# Patient Record
Sex: Female | Born: 1995 | Race: Black or African American | Hispanic: No | Marital: Single | Smoking: Former smoker
Health system: Southern US, Community
[De-identification: ages and names within clinical notes are randomized; demographics above are authoritative.]

---
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
## 2014-10-23 DIAGNOSIS — O14 Pre-eclampsia: Secondary | ICD-10-CM

## 2019-04-01 ENCOUNTER — Encounter: Payer: Medicaid Other | Admitting: Certified Nurse Midwife

## 2019-04-12 ENCOUNTER — Encounter: Payer: Medicaid Other | Admitting: Certified Nurse Midwife

## 2019-08-19 ENCOUNTER — Ambulatory Visit: Payer: Medicaid Other | Attending: Internal Medicine

## 2020-04-11 ENCOUNTER — Telehealth: Payer: Self-pay

## 2020-04-11 ENCOUNTER — Ambulatory Visit: Payer: Medicaid Other | Admitting: Obstetrics and Gynecology

## 2020-04-11 DIAGNOSIS — Z3201 Encounter for pregnancy test, result positive: Secondary | ICD-10-CM

## 2020-04-11 NOTE — Telephone Encounter (Signed)
Patient is being referred for Irregular menses and infertility with [facility] for 04/28/20. Patient reports 3 positive pregnancy with a negative pregnancy test. Patient is requesting for labs to be drawn. Would you place order?

## 2020-04-11 NOTE — Telephone Encounter (Signed)
Patient is scheduled for labs 05/12/20

## 2020-04-11 NOTE — Telephone Encounter (Signed)
Order placed. Pt needs lab appt. Thx

## 2020-04-12 ENCOUNTER — Other Ambulatory Visit: Payer: Medicaid Other

## 2020-04-12 ENCOUNTER — Other Ambulatory Visit: Payer: Self-pay

## 2020-04-12 ENCOUNTER — Ambulatory Visit: Payer: Medicaid Other | Admitting: Obstetrics and Gynecology

## 2020-04-12 ENCOUNTER — Encounter: Payer: Medicaid Other | Admitting: Obstetrics and Gynecology

## 2020-04-12 DIAGNOSIS — Z3201 Encounter for pregnancy test, result positive: Secondary | ICD-10-CM

## 2020-04-13 LAB — BETA HCG QUANT (REF LAB): hCG Quant: <1 m[IU]/mL

## 2020-04-13 NOTE — Progress Notes (Signed)
Pls let pt know pregnancy test neg. Has appt with [facility] 12/3.

## 2020-04-13 NOTE — Progress Notes (Signed)
Pt aware. Asked her lab result number, given <1.

## 2020-04-28 ENCOUNTER — Encounter: Payer: Medicaid Other | Admitting: Obstetrics and Gynecology

## 2020-04-28 ENCOUNTER — Ambulatory Visit (INDEPENDENT_AMBULATORY_CARE_PROVIDER_SITE_OTHER): Payer: Medicaid Other | Admitting: Obstetrics and Gynecology

## 2020-04-28 ENCOUNTER — Other Ambulatory Visit: Payer: Self-pay

## 2020-04-28 ENCOUNTER — Other Ambulatory Visit (HOSPITAL_COMMUNITY)
Admission: RE | Admit: 2020-04-28 | Discharge: 2020-04-28 | Disposition: A | Discharge status: Home / Self Care | Payer: Medicaid Other | Source: Ambulatory Visit | Attending: Obstetrics and Gynecology | Admitting: Obstetrics and Gynecology

## 2020-04-28 ENCOUNTER — Encounter: Payer: Self-pay | Admitting: Obstetrics and Gynecology

## 2020-04-28 VITALS — BP 136/72 | Ht 63.0 in | Wt 211.0 lb

## 2020-04-28 DIAGNOSIS — Z3169 Encounter for other general counseling and advice on procreation: Secondary | ICD-10-CM

## 2020-04-28 DIAGNOSIS — Z124 Encounter for screening for malignant neoplasm of cervix: Secondary | ICD-10-CM | POA: Diagnosis present

## 2020-04-28 DIAGNOSIS — Z113 Encounter for screening for infections with a predominantly sexual mode of transmission: Secondary | ICD-10-CM | POA: Diagnosis not present

## 2020-04-28 DIAGNOSIS — N925 Other specified irregular menstruation: Secondary | ICD-10-CM | POA: Diagnosis not present

## 2020-04-28 DIAGNOSIS — N839 Noninflammatory disorder of ovary, fallopian tube and broad ligament, unspecified: Secondary | ICD-10-CM

## 2020-04-28 DIAGNOSIS — N939 Abnormal uterine and vaginal bleeding, unspecified: Secondary | ICD-10-CM

## 2020-04-28 DIAGNOSIS — Z1329 Encounter for screening for other suspected endocrine disorder: Secondary | ICD-10-CM

## 2020-04-28 NOTE — Progress Notes (Signed)
Patient ID: Emily Pearson, female   DOB: 01-15-1996, 24 y.o.   MRN: [id number]  Reason for Consult: Gynecologic Exam   Referred by Cyndia Diver, PA-C  Subjective:     HPI:  Emily Pearson is a 24 y.o. female. She was referred for irregular menses, although she notes normal menstruation. She reports she has been trying to conceive for over a year and desires to obtain pregnancy.   Gynecological History Describes periods as occurring in a monthly fashion. 5-7 days of bleeding.  Last pap smear: unknown History of STDs: denies Sexually Active: yes  Obstetrical History  OB History  Gravida Para Term Preterm AB Living  3 2 2   1 2   SAB IAB Ectopic Multiple Live Births  1            # Outcome Date GA Lbr Len/2nd Weight Sex Delivery Anes PTL Lv  3 Term 10/23/14   7 lb 8 oz (3.402 kg) F         Complications: Pre-eclampsia  2 Term 11/13/12   3 lb 9 oz (1.616 kg) M      1 SAB 2013             Complications: Pre-eclampsia     History reviewed. No pertinent past medical history. Family History  Problem Relation Age of Onset  . Hypertension Mother   . Hyperlipidemia Mother    History reviewed. No pertinent surgical history.  Short Social History:  Social History   Tobacco Use  . Smoking status: Former 2014  . Smokeless tobacco: Never Used  Substance Use Topics  . Alcohol use: Yes    Comment: occ    No Known Allergies  No current outpatient medications on file.   No current facility-administered medications for this visit.    Review of Systems  Constitutional: Negative for chills, fatigue, fever and unexpected weight change.  HENT: Negative for trouble swallowing.  Eyes: Negative for loss of vision.  Respiratory: Negative for cough, shortness of breath and wheezing.  Cardiovascular: Negative for chest pain, leg swelling, palpitations and syncope.  GI: Negative for abdominal pain, blood in stool, diarrhea, nausea and vomiting.  GU: Negative for difficulty  urinating, dysuria, frequency and hematuria.  Musculoskeletal: Negative for back pain, leg pain and joint pain.  Skin: Negative for rash.  Neurological: Negative for dizziness, headaches, light-headedness, numbness and seizures.  Psychiatric: Negative for behavioral problem, confusion, depressed mood and sleep disturbance.        Objective:  Objective   Vitals:   04/28/20 1017  BP: 136/72  Weight: 211 lb (95.7 kg)  Height: 5\' 3"  (1.6 m)   Body mass index is 37.38 kg/m.  Physical Exam Vitals and nursing note reviewed.  Constitutional:      Appearance: She is well-developed.  HENT:     Head: Normocephalic and atraumatic.  Eyes:     Pupils: Pupils are equal, round, and reactive to light.  Cardiovascular:     Rate and Rhythm: Normal rate and regular rhythm.  Pulmonary:     Effort: Pulmonary effort is normal. No respiratory distress.  Genitourinary:    Comments: External: Normal appearing vulva. No lesions noted.  Speculum examination: Normal appearing cervix. No blood in the vaginal vault. no discharge.   Bimanual examination: Uterus midline, non-tender, normal in size, shape and contour.  No CMT. No adnexal masses. No adnexal tenderness. Pelvis not fixed.    Skin:    General: Skin is warm and dry.  Neurological:  Mental Status: She is alert and oriented to person, place, and time.  Psychiatric:        Behavior: Behavior normal.        Thought Content: Thought content normal.        Judgment: Judgment normal.     Assessment/Plan:     24 yo with irregular menses.  She would like to follow up regarding infertility HSG planned Will plan progesterone testing [facility] and TSH testing today  [facility] infertility referral  Pap smear and STD testing today Will return for pelvic US.   More than 30 minutes were spent face to face with the patient in the room, reviewing the medical record, labs and images, and coordinating care for the patient. The plan of management was  discussed in detail and counseling was provided.    Adelene Idler MD [facility], [facility] 04/28/2020 11:12 AM

## 2020-04-28 NOTE — Patient Instructions (Addendum)
Hysterosalpingography  Hysterosalpingography is a procedure in which a doctor looks inside a woman's womb (uterus) and fallopian tubes. During the procedure, a dye is injected into the womb. Then X-rays are taken. The dye makes the womb show up on X-rays. What happens before the procedure?  Schedule the procedure after your period stops, but before your next ovulation. This is usually between day 5 and day 10 of your last period. Day 1 is the first day of your period.  Ask your doctor about changing or stopping your normal medicines. This is important if you take diabetes medicines or blood thinners.  Pee before the procedure starts.  Plan to have someone take you home. What happens during the procedure?  You may be given one of these: ? A medicine to help you relax (sedative). ? An over-the-counter pain medicine.  You will lie down on your back. Your feet will be placed in foot rests (stirrups).  A device (speculum) will be placed into your vagina. This lets your doctor see the lower part of your womb (cervix).  Your cervix will be washed with a soap that kills germs.  A medicine may be injected into your cervix to numb it (local anesthesia).  A tube will be passed into your womb.  Dye will be passed through the tube and into the womb. The dye may cause some cramps.  X-rays will be taken.  The tube will be taken out. The dye will flow out of your vagina on its own. The procedure may vary among doctors and hospitals. What happens after the procedure?  Most of the dye will flow out on its own. Wear a pad if needed.  You may have mild cramping and vaginal bleeding.  Do not drive for 24 hours if you were given a medicine to help you relax.  It is up to you to get the results of your test. Ask your doctor when your results will be ready. Summary  Hysterosalpingography is a procedure in which a doctor looks inside a woman's womb and fallopian tubes.  In this procedure, dye  is injected into the womb. Then, X-rays are taken. The dye makes the womb show up on the X-rays.  Plan to have this procedure after your period stops, but before your next ovulation. This is often between days 5 and 10 of your last period. Day 1 is the first day of your period.  After the procedure, you may have mild cramping and bleeding. Most of the dye will flow out on its own. Wear a pad if needed. This information is not intended to replace advice given to you by your health care provider. Make sure you discuss any questions you have with your health care provider. Document Revised: 04/25/2017 Document Reviewed: 06/05/2016 Elsevier Patient Education  2020 Elsevier Inc.   Female Infertility  Female infertility refers to a woman's inability to get pregnant (conceive) after a year of having sex regularly (or after 6 months in women over age 35) without using birth control. Infertility can also mean that a woman is not able to carry a pregnancy to full term. Both women and men can have fertility problems. What are the causes? This condition may be caused by:  Problems with reproductive organs. Infertility can result if a woman: ? Has an abnormally short cervix or a cervix that does not remain closed during a pregnancy. ? Has a blockage or scarring in the fallopian tubes. ? Has an abnormally shaped uterus. ? Has   uterine fibroids. This is a benign mass of tissue or muscle (tumor) that can develop in the uterus. ? Is not ovulating in a regular way.  Certain medical conditions. These may include: ? Polycystic ovary syndrome (PCOS). This is a hormonal disorder that can cause small cysts to grow on the ovaries. This is the most common cause of infertility in women. ? Endometriosis. This is a condition in which the tissue that lines the uterus (endometrium) grows outside of its normal location. ? Cancer and cancer treatments, such as chemotherapy or radiation. ? Premature ovarian failure. This  is when ovaries stop producing eggs and hormones before age 40. ? Sexually transmitted diseases, such as chlamydia or gonorrhea. ? Autoimmune disorders. These are disorders in which the body's defense system (immune system) attacks normal, healthy cells. Infertility can be linked to more than one cause. For some women, the cause of infertility is not known (unexplained infertility). What increases the risk?  Age. A woman's fertility declines with age, especially after her mid-30s.  Being underweight or overweight.  Drinking too much alcohol.  Using drugs such as anabolic steroids, cocaine, and marijuana.  Exercising excessively.  Being exposed to environmental toxins, such as radiation, pesticides, and certain chemicals. What are the signs or symptoms? The main sign of infertility in women is the inability to get pregnant or carry a pregnancy to full term. How is this diagnosed? This condition may be diagnosed by:  Checking whether you are ovulating each month. The tests may include: ? Blood tests to check hormone levels. ? An ultrasound of the ovaries. ? Taking a small tissue that lines the uterus and checking it under a microscope (endometrial biopsy).  Doing additional tests. This is done if ovulation is normal. Tests may include: ? Hysterosalpingography. This X-ray test can show the shape of the uterus and whether the fallopian tubes are open. ? Laparoscopy. This test uses a lighted tube (laparoscope) to look for problems in the fallopian tubes and other organs. ? Transvaginal ultrasound. This imaging test is used to check for abnormalities in the uterus and ovaries. ? Hysteroscopy. This test uses a lighted tube to check for problems in the cervix and the uterus. To be diagnosed with infertility, both partners will have a physical exam. Both partners will also have an extensive medical and sexual history taken. Additional tests may be done. How is this treated? Treatment depends  on the cause of infertility. Most cases of infertility in women are treated with medicine or surgery.  Women may take medicine to: ? Correct ovulation problems. ? Treat other health conditions.  Surgery may be done to: ? Repair damage to the ovaries, fallopian tubes, cervix, or uterus. ? Remove growths from the uterus. ? Remove scar tissue from the uterus, pelvis, or other organs. Assisted reproductive technology (ART) Assisted reproductive technology (ART) refers to all treatments and procedures that combine eggs and sperm outside the body to try to help a couple conceive. ART is often combined with fertility drugs to stimulate ovulation. Sometimes ART is done using eggs retrieved from another woman's body (donor eggs) or from previously frozen fertilized eggs (embryos). There are different types of ART. These include:  Intrauterine insemination (IUI). A long, thin tube is used to place sperm directly into a woman's uterus. This procedure: ? Is effective for infertility caused by sperm problems, including low sperm count and low motility. ? Can be used in combination with fertility drugs.  In vitro fertilization (IVF). This is done when   a woman's fallopian tubes are blocked or when a man has low sperm count. In this procedure: ? Fertility drugs are used to stimulate the ovaries to produce multiple eggs. ? Once mature, these eggs are removed from the body and combined with the sperm to be fertilized. ? The fertilized eggs are then placed into the woman's uterus. Follow these instructions at home:  Take over-the-counter and prescription medicines only as told by your health care provider.  Do not use any products that contain nicotine or tobacco, such as cigarettes and e-cigarettes. If you need help quitting, ask your health care provider.  If you drink alcohol, limit how much you have to 1 drink a day.  Make dietary changes to lose weight or maintain a healthy weight. Work with your  health care provider and a dietitian to set a weight-loss goal that is healthy and reasonable for you.  Seek support from a counselor or support group to talk about your concerns related to infertility. Couples counseling may be helpful for you and your partner.  Practice stress reduction techniques that work well for you, such as regular physical activity, meditation, or deep breathing.  Keep all follow-up visits as told by your health care provider. This is important. Contact a health care provider if you:  Feel that stress is interfering with your life and relationships.  Have side effects from treatments for infertility. Summary  Female infertility refers to a woman's inability to get pregnant (conceive) after a year of having sex regularly (or after 6 months in women over age 35) without using birth control.  To be diagnosed with infertility, both partners will have a physical exam. Both partners will also have an extensive medical and sexual history taken.  Seek support from a counselor or support group to talk about your concerns related to infertility. Couples counseling may be helpful for you and your partner. This information is not intended to replace advice given to you by your health care provider. Make sure you discuss any questions you have with your health care provider. Document Revised: 09/03/2018 Document Reviewed: 04/14/2017 Elsevier Patient Education  2020 Elsevier Inc.  

## 2020-04-28 NOTE — Progress Notes (Signed)
[facility] referring for irregular menses [facility] paper record

## 2020-05-01 ENCOUNTER — Telehealth: Payer: Self-pay | Admitting: Obstetrics and Gynecology

## 2020-05-01 ENCOUNTER — Other Ambulatory Visit: Payer: Self-pay

## 2020-05-01 DIAGNOSIS — N839 Noninflammatory disorder of ovary, fallopian tube and broad ligament, unspecified: Secondary | ICD-10-CM

## 2020-05-01 NOTE — Telephone Encounter (Signed)
Her progesterone labs have to be within 20-23 days after the first day of her menstrual cycle. She will also need a copy of her labs picked up from here and taken with her to that outside lab. They tend not to transfer over in the computers.

## 2020-05-01 NOTE — Telephone Encounter (Signed)
I rec'd call from pt wanting to schedule HSG w [facility]. She adv LMP 04/26/20.  She is scheduled for HSG @ [facility] - 05/02/20 @ 1:30  She also adv that she needed to schedule labwork at [facility] outside facility for progesterone levels. I was able to pull up the [facility] scheduling site and assisted her with scheduling that appt. She is scheduled for 05/01/20 at 1:30 at [address] [facility] location.

## 2020-05-02 ENCOUNTER — Ambulatory Visit
Admission: RE | Admit: 2020-05-02 | Discharge: 2020-05-02 | Disposition: A | Discharge status: Home / Self Care | Payer: Medicaid Other | Source: Ambulatory Visit | Attending: Obstetrics and Gynecology | Admitting: Obstetrics and Gynecology

## 2020-05-02 ENCOUNTER — Other Ambulatory Visit: Payer: Self-pay

## 2020-05-02 ENCOUNTER — Other Ambulatory Visit: Payer: Self-pay | Admitting: Obstetrics and Gynecology

## 2020-05-02 DIAGNOSIS — N926 Irregular menstruation, unspecified: Secondary | ICD-10-CM

## 2020-05-02 DIAGNOSIS — N979 Female infertility, unspecified: Secondary | ICD-10-CM

## 2020-05-02 DIAGNOSIS — N7011 Chronic salpingitis: Secondary | ICD-10-CM

## 2020-05-02 DIAGNOSIS — Z3169 Encounter for other general counseling and advice on procreation: Secondary | ICD-10-CM

## 2020-05-02 DIAGNOSIS — N839 Noninflammatory disorder of ovary, fallopian tube and broad ligament, unspecified: Secondary | ICD-10-CM

## 2020-05-02 LAB — CYTOLOGY - PAP
Chlamydia: NEGATIVE
Comment: NEGATIVE
Comment: NEGATIVE
Comment: NORMAL
Diagnosis: NEGATIVE
Neisseria Gonorrhea: NEGATIVE
Trichomonas: NEGATIVE

## 2020-05-02 LAB — PROGESTERONE: Progesterone: 0.2 ng/mL

## 2020-05-02 MED ORDER — IBUPROFEN 600 MG PO TABS: 600.0000 mg | ORAL_TABLET | Freq: Four times a day (QID) | ORAL | 0 refills | Status: DC | PRN

## 2020-05-02 MED ORDER — IOHEXOL 300 MG/ML  SOLN
30.0000 mL | Freq: Once | INTRAMUSCULAR | Status: AC | PRN
  Administered 2020-05-02: 30 mL via INTRATHECAL

## 2020-05-02 MED ORDER — TRAMADOL HCL 50 MG PO TABS: 50.0000 mg | ORAL_TABLET | Freq: Four times a day (QID) | ORAL | 0 refills | Status: DC | PRN

## 2020-05-02 MED ORDER — DOXYCYCLINE HYCLATE 100 MG PO CAPS: 100.0000 mg | ORAL_CAPSULE | Freq: Two times a day (BID) | ORAL | 0 refills | Status: AC

## 2020-05-02 NOTE — Progress Notes (Signed)
Prep and drape done.  Cervix normal.  Tenaculum placed. Cervix dilated minimally.  Catheter placed into uterine cavity. Approximately 20 mL dye injected under fluoroscopic visualization.  Patent bilateral fallopian tube seen.  Catheter and tenaculum removed.  Pt stable, tolerated procedure well.  Discussed result of HSG with patient. Both fallopian tubes dilated, good spillage of fluid on the left. Right fallopian tube appears blocked.   Discussed labs remaining as part on infertility evaluation. Patient will return for day 20-23 labs.  Discussed risks of ectopic pregnancy associated with dilated fallopian tubes and encouraged early prenatal visit for trending of beta hcg levels if pregnancy is achieved.   Prescription for doxycycline and pain medication sent to pharmacy for the patient.   Adelene Idler MD, [facility], [facility] 05/02/2020 2:33 PM

## 2020-05-29 ENCOUNTER — Ambulatory Visit: Payer: Medicaid Other

## 2020-05-29 ENCOUNTER — Ambulatory Visit: Payer: Medicaid Other | Admitting: Obstetrics and Gynecology

## 2020-08-09 ENCOUNTER — Ambulatory Visit (INDEPENDENT_AMBULATORY_CARE_PROVIDER_SITE_OTHER): Payer: Medicaid Other | Admitting: Advanced Practice Midwife

## 2020-08-09 ENCOUNTER — Other Ambulatory Visit (HOSPITAL_COMMUNITY)
Admission: RE | Admit: 2020-08-09 | Discharge: 2020-08-09 | Disposition: A | Discharge status: Home / Self Care | Payer: Medicaid Other | Source: Ambulatory Visit | Attending: Advanced Practice Midwife | Admitting: Advanced Practice Midwife

## 2020-08-09 ENCOUNTER — Encounter: Payer: Self-pay | Admitting: Advanced Practice Midwife

## 2020-08-09 ENCOUNTER — Other Ambulatory Visit: Payer: Self-pay

## 2020-08-09 VITALS — BP 130/80 | Wt 211.0 lb

## 2020-08-09 DIAGNOSIS — Z369 Encounter for antenatal screening, unspecified: Secondary | ICD-10-CM | POA: Insufficient documentation

## 2020-08-09 DIAGNOSIS — Z113 Encounter for screening for infections with a predominantly sexual mode of transmission: Secondary | ICD-10-CM

## 2020-08-09 DIAGNOSIS — Z130 Encounter for screening for diseases of the blood and blood-forming organs and certain disorders involving the immune mechanism: Secondary | ICD-10-CM

## 2020-08-09 DIAGNOSIS — O09299 Supervision of pregnancy with other poor reproductive or obstetric history, unspecified trimester: Secondary | ICD-10-CM

## 2020-08-09 DIAGNOSIS — O0991 Supervision of high risk pregnancy, unspecified, first trimester: Secondary | ICD-10-CM | POA: Insufficient documentation

## 2020-08-09 DIAGNOSIS — O99211 Obesity complicating pregnancy, first trimester: Secondary | ICD-10-CM

## 2020-08-09 DIAGNOSIS — Z131 Encounter for screening for diabetes mellitus: Secondary | ICD-10-CM

## 2020-08-09 DIAGNOSIS — Z1379 Encounter for other screening for genetic and chromosomal anomalies: Secondary | ICD-10-CM

## 2020-08-09 DIAGNOSIS — Z3A01 Less than 8 weeks gestation of pregnancy: Secondary | ICD-10-CM

## 2020-08-09 NOTE — Patient Instructions (Signed)

## 2020-08-10 LAB — PROTEIN / CREATININE RATIO, URINE
Creatinine, Urine: 53.8 mg/dL
Protein, Ur: 5.1 mg/dL
Protein/Creat Ratio: 95 mg/g creat (ref 0–200)

## 2020-08-11 ENCOUNTER — Encounter: Payer: Self-pay | Admitting: Advanced Practice Midwife

## 2020-08-11 DIAGNOSIS — O0991 Supervision of high risk pregnancy, unspecified, first trimester: Secondary | ICD-10-CM | POA: Insufficient documentation

## 2020-08-11 DIAGNOSIS — O09299 Supervision of pregnancy with other poor reproductive or obstetric history, unspecified trimester: Secondary | ICD-10-CM | POA: Insufficient documentation

## 2020-08-11 DIAGNOSIS — O99211 Obesity complicating pregnancy, first trimester: Secondary | ICD-10-CM | POA: Insufficient documentation

## 2020-08-11 LAB — CERVICOVAGINAL ANCILLARY ONLY
Chlamydia: NEGATIVE
Comment: NEGATIVE
Comment: NEGATIVE
Comment: NORMAL
Neisseria Gonorrhea: NEGATIVE
Trichomonas: NEGATIVE

## 2020-08-11 LAB — URINE CULTURE

## 2020-08-11 NOTE — Addendum Note (Signed)
Addended by: Tresea Mall on: 08/11/2020 12:02 PM   Modules accepted: Orders

## 2020-08-11 NOTE — Progress Notes (Addendum)
New Obstetric Patient H&P  Date of Service: 08/09/2020  Chief Complaint: "Desires prenatal care"   History of Present Illness: Patient is a 25 y.o. [id number] Not Hispanic or Latino female, presents with amenorrhea and positive home pregnancy test. Patient's last menstrual period was 06/18/2020. and based on her  LMP, her EDD is Estimated Date of Delivery: 03/25/21 and her EGA is [redacted]w[redacted]d. Cycles are 5 days, regular, and occur approximately every : 26 days. Her last pap smear was 3 months ago and was no abnormalities.    She had a urine pregnancy test which was positive 3 week(s)  ago. Her last menstrual period was normal and lasted for  5 day(s). Since her LMP she claims she has experienced breast tenderness, fatigue, nausea, vomiting 2x. She denies vaginal bleeding. Her past medical history is noncontributory. Her prior pregnancies are notable for G1 SAB 2013, G2 2014 PT SVD IUGR Preeclampsia female 3#9oz, G3 2016 FT SVD Preeclampsia female 7#8oz.  Since her LMP, she admits to the use of tobacco products  She quit w +UPT She claims she has gained 6 pounds since the start of her pregnancy.  There are cats in the home in the home  no  She admits close contact with children on a regular basis  yes  She has had chicken pox in the past no She has had Tuberculosis exposures, symptoms, or previously tested positive for TB   no Current or past history of domestic violence. no  Genetic Screening/Teratology Counseling: (Includes patient, baby's father, or anyone in either family with:)   1. Patient's age >/= 16 [facility]  no 2. Thalassemia ([place], [place], Mediterranean, or Asian background): MCV<80  no 3. Neural tube defect (meningomyelocele, spina bifida, anencephaly)  no 4. Congenital heart defect  no  5. Down syndrome  Patient's aunt (by marriage) has a child with DS 6. Tay-Sachs (Jewish, [place]  no 7. Canavan's Disease  no 8. Sickle cell disease or trait (African)  no  9. Hemophilia or  other blood disorders  no  10. Muscular dystrophy  no  11. Cystic fibrosis  no  12. Huntington's Chorea  no  13. Mental retardation/autism  no 14. Other inherited genetic or chromosomal disorder  no 15. Maternal metabolic disorder (DM, PKU, etc)  no 16. Patient or FOB with a child with a birth defect not listed above no  16a. Patient or FOB with a birth defect themselves no 17. Recurrent pregnancy loss, or stillbirth  no  18. Any medications since LMP other than prenatal vitamins (include vitamins, supplements, OTC meds, drugs, alcohol)  no 19. Any other genetic/environmental exposure to discuss  no  Infection History:   1. Lives with someone with TB or TB exposed  no  2. Patient or partner has history of genital herpes  no 3. Rash or viral illness since LMP  no 4. History of STI (GC, CT, HPV, syphilis, HIV)  no 5. History of recent travel :  no  Other pertinent information:  no    Review of Systems:10 point review of systems negative unless otherwise noted in HPI  Past Medical History:  Patient Active Problem List   Diagnosis Date Noted  . Hx of preeclampsia, prior pregnancy, currently pregnant 08/11/2020  . Supervision of high risk pregnancy in first trimester 08/11/2020    Clinic [facility] Prenatal Labs  Dating  Blood type:     Genetic Screen 1 Screen:    AFP:     Quad:  NIPS: Antibody:   Anatomic [place]  Rubella:    Varicella: @VZVIGG @  GTT Early:                Third trimester:  RPR:     Rhogam  HBsAg:     Vaccines TDAP:                       Flu Shot: Covid: HIV:     Baby Food                                GBS:   GC/CT:  Contraception  Pap: December 2021 negative  CBB     CS/VBAC    Support Person January 2022       . Obesity affecting pregnancy in first trimester 08/11/2020    Past Surgical History:  History reviewed. No pertinent surgical history.  Gynecologic History: Patient's last menstrual period was 06/18/2020.  Obstetric History: 06/20/2020  Family  History:  Family History  Problem Relation Age of Onset  . Hypertension Mother   . Hyperlipidemia Mother     Social History:  Social History   Socioeconomic History  . Marital status: Single    Spouse name: Not on file  . Number of children: Not on file  . Years of education: Not on file  . Highest education level: Not on file  Occupational History  . Not on file  Tobacco Use  . Smoking status: Former [id number]  . Smokeless tobacco: Never Used  Substance and Sexual Activity  . Alcohol use: Yes    Comment: occ  . Drug use: Not Currently  . Sexual activity: Yes    Birth control/protection: None  Other Topics Concern  . Not on file  Social History Narrative  . Not on file   Social Determinants of Health   Financial Resource Strain: Not on file  Food Insecurity: Not on file  Transportation Needs: Not on file  Physical Activity: Not on file  Stress: Not on file  Social Connections: Not on file  Intimate Partner Violence: Not on file    Allergies:  No Known Allergies  Medications: Prior to Admission medications   Medication Sig Start Date End Date Taking? Authorizing Provider  Prenatal Vit-Fe Fumarate-FA (MULTIVITAMIN-PRENATAL) 27-0.8 MG TABS tablet Take 1 tablet by mouth daily at 12 noon.   Yes [provider]  ibuprofen (ADVIL) 600 MG tablet Take 1 tablet (600 mg total) by mouth every 6 (six) hours as needed. Patient not taking: Reported on 08/09/2020 05/02/20   14/7/21, MD  traMADol (ULTRAM) 50 MG tablet Take 1 tablet (50 mg total) by mouth every 6 (six) hours as needed. Patient not taking: Reported on 08/09/2020 05/02/20   14/7/21, MD    Physical Exam Vitals: Blood pressure 130/80, weight 211 lb (95.7 kg), last menstrual period 06/18/2020.  General: NAD HEENT: normocephalic, anicteric Thyroid: no enlargement, no palpable nodules Pulmonary: No increased work of breathing, CTAB Cardiovascular: RRR, distal pulses 2+ Abdomen:  NABS, soft, non-tender, non-distended.  Umbilicus without lesions.  No hepatomegaly, splenomegaly or masses palpable. No evidence of hernia  Genitourinary:  External: Normal external female genitalia.  Normal urethral meatus, normal Bartholin's and Skene's glands.    Vagina: Normal vaginal mucosa, no evidence of prolapse.    Cervix: Grossly normal in appearance, no bleeding, no CMT  Uterus:  Non-enlarged, mobile, normal contour.    Adnexa: ovaries  non-enlarged, no adnexal masses  Rectal: deferred Extremities: no edema, erythema, or tenderness Neurologic: Grossly intact Psychiatric: mood appropriate, affect full   The following were addressed during this visit:  Breastfeeding Education - Early initiation of breastfeeding    Comments: Keeps milk supply adequate, helps contract uterus and slow bleeding, and early milk is the perfect first food and is easy to digest.   - The importance of exclusive breastfeeding    Comments: Provides antibodies, Lower risk of breast and ovarian cancers, and type-2 diabetes,Helps your body recover, Reduced chance of SIDS.   - Risks of giving your baby anything other than breast milk if you are breastfeeding    Comments: Make the baby less content with breastfeeds, may make my baby more susceptible to illness, and may reduce my milk supply.   - The importance of early skin-to-skin contact    Comments: Keeps baby warm and secure, helps keep baby's blood sugar up and breathing steady, easier to bond and breastfeed, and helps calm baby.  - Rooming-in on a 24-hour basis    Comments: Easier to learn baby's feeding cues, easier to bond and get to know each other, and encourages milk production.   - Feeding on demand or baby-led feeding    Comments: Helps prevent breastfeeding complications, helps bring in good milk supply, prevents under or overfeeding, and helps baby feel content and satisfied   - Frequent feeding to help assure optimal milk production     Comments: Making a full supply of milk requires frequent removal of milk from breasts, infant will eat 8-12 times in 24 hours, if separated from infant use breast massage, hand expression and/ or pumping to remove milk from breasts.   - Effective positioning and attachment    Comments: Helps my baby to get enough breast milk, helps to produce an adequate milk supply, and helps prevent nipple pain and damage   - Exclusive breastfeeding for the first 6 months    Comments: Builds a healthy milk supply and keeps it up, protects baby from sickness and disease, and breastmilk has everything your baby needs for the first 6 months.   Assessment: 25 y.o. [id number] at [redacted]w[redacted]d by LMP presenting to initiate prenatal care  Plan: 1) Avoid alcoholic beverages. 2) Patient encouraged not to smoke.  3) Discontinue the use of all non-medicinal drugs and chemicals.  4) Take prenatal vitamins daily.  5) Nutrition, food safety (fish, cheese advisories, and high nitrite foods) and exercise discussed. 6) Hospital and practice style discussed with cross coverage system.  7) Genetic Screening, such as with 1st Trimester Screening, cell free fetal DNA, AFP testing, and Ultrasound, as well as with amniocentesis and CVS as appropriate, is discussed with patient. At the conclusion of today's visit patient declined genetic testing 8) Patient is asked about travel to areas at risk for the [place] virus, and counseled to avoid travel and exposure to mosquitoes or sexual partners who may have themselves been exposed to the virus. Testing is discussed, and will be ordered as appropriate. 9) Aptima, urine culture, P/C ratio today 10) Return to clinic in 1 week for dating, early 1 hr gtt, NOB panel, sickle cell screen, CMP, ROB   Tresea Mall, CNM [facility] 08/11/2020, 10:59 AM

## 2020-08-15 ENCOUNTER — Ambulatory Visit (INDEPENDENT_AMBULATORY_CARE_PROVIDER_SITE_OTHER): Payer: Medicaid Other

## 2020-08-15 ENCOUNTER — Encounter: Payer: Medicaid Other | Admitting: Advanced Practice Midwife

## 2020-08-15 ENCOUNTER — Other Ambulatory Visit: Payer: Self-pay

## 2020-08-15 ENCOUNTER — Other Ambulatory Visit: Payer: Self-pay | Admitting: Advanced Practice Midwife

## 2020-08-15 DIAGNOSIS — O0991 Supervision of high risk pregnancy, unspecified, first trimester: Secondary | ICD-10-CM | POA: Diagnosis not present

## 2020-08-15 DIAGNOSIS — Z369 Encounter for antenatal screening, unspecified: Secondary | ICD-10-CM

## 2020-08-16 ENCOUNTER — Other Ambulatory Visit: Payer: Medicaid Other

## 2020-08-16 ENCOUNTER — Encounter: Payer: Medicaid Other | Admitting: Obstetrics and Gynecology

## 2020-08-17 ENCOUNTER — Other Ambulatory Visit: Payer: Medicaid Other

## 2020-08-17 ENCOUNTER — Ambulatory Visit (INDEPENDENT_AMBULATORY_CARE_PROVIDER_SITE_OTHER): Payer: Medicaid Other | Admitting: Advanced Practice Midwife

## 2020-08-17 ENCOUNTER — Encounter: Payer: Self-pay | Admitting: Advanced Practice Midwife

## 2020-08-17 ENCOUNTER — Other Ambulatory Visit: Payer: Self-pay

## 2020-08-17 VITALS — BP 100/70 | Wt 211.0 lb

## 2020-08-17 DIAGNOSIS — O0991 Supervision of high risk pregnancy, unspecified, first trimester: Secondary | ICD-10-CM

## 2020-08-17 DIAGNOSIS — O99211 Obesity complicating pregnancy, first trimester: Secondary | ICD-10-CM

## 2020-08-17 DIAGNOSIS — Z1379 Encounter for other screening for genetic and chromosomal anomalies: Secondary | ICD-10-CM

## 2020-08-17 DIAGNOSIS — Z113 Encounter for screening for infections with a predominantly sexual mode of transmission: Secondary | ICD-10-CM

## 2020-08-17 DIAGNOSIS — Z130 Encounter for screening for diseases of the blood and blood-forming organs and certain disorders involving the immune mechanism: Secondary | ICD-10-CM

## 2020-08-17 DIAGNOSIS — Z369 Encounter for antenatal screening, unspecified: Secondary | ICD-10-CM

## 2020-08-17 DIAGNOSIS — O09299 Supervision of pregnancy with other poor reproductive or obstetric history, unspecified trimester: Secondary | ICD-10-CM

## 2020-08-17 DIAGNOSIS — Z3A08 8 weeks gestation of pregnancy: Secondary | ICD-10-CM

## 2020-08-17 DIAGNOSIS — Z131 Encounter for screening for diabetes mellitus: Secondary | ICD-10-CM

## 2020-08-17 LAB — POCT URINALYSIS DIPSTICK OB
Glucose, UA: NEGATIVE
POC,PROTEIN,UA: NEGATIVE

## 2020-08-17 NOTE — Progress Notes (Signed)
  Routine Prenatal Care Visit  Subjective  Emily Pearson is a 25 y.o. [phone] at [redacted]w[redacted]d being seen today for ongoing prenatal care.  She is currently monitored for the following issues for this high-risk pregnancy and has Hx of preeclampsia, prior pregnancy, currently pregnant; Supervision of high risk pregnancy in first trimester; and Obesity affecting pregnancy in first trimester on their problem list.  ----------------------------------------------------------------------------------- Patient reports no complaints.   Contractions: Not present. Vag. Bleeding: None.   . Leaking Fluid denies.  ----------------------------------------------------------------------------------- The following portions of the patient's history were reviewed and updated as appropriate: allergies, current medications, past family history, past medical history, past social history, past surgical history and problem list. Problem list updated.  Objective  Blood pressure 100/70, weight 211 lb (95.7 kg), last menstrual period 06/18/2020. Pregravid weight 205 lb (93 kg) Total Weight Gain 6 lb (2.722 kg) Urinalysis: Urine Protein    Urine Glucose    Fetal Status:            Dating scan 08/15/20: equals LMP, no adjustment of EDD  General:  Alert, oriented and cooperative. Patient is in no acute distress.  Skin: Skin is warm and dry. No rash noted.   Cardiovascular: Normal heart rate noted  Respiratory: Normal respiratory effort, no problems with respiration noted  Abdomen: Soft, gravid, appropriate for gestational age. Pain/Pressure: Absent     Pelvic:  Cervical exam deferred        Extremities: Normal range of motion.     Mental Status: Normal mood and affect. Normal behavior. Normal judgment and thought content.   Assessment   25 y.o. [id number] at [redacted]w[redacted]d by  03/25/2021, by Last Menstrual Period presenting for routine prenatal visit  Plan   [email or web address]  Problems (from 06/18/20 to present)    Problem Noted Resolved    Supervision of high risk pregnancy in first trimester 08/11/2020 by Tresea Mall, CNM No   Overview Addendum 08/17/2020 10:29 AM by Tresea Mall, CNM    Clinic [facility] Prenatal Labs  Dating EDD by LMP=8w Blood type:     Genetic Screen 1 Screen:    AFP:     Quad:     NIPS: Antibody:   Anatomic [place]  Rubella:    Varicella: @VZVIGG @  GTT Early:                Third trimester:  RPR:     Rhogam  HBsAg:     Vaccines TDAP:                       Flu Shot: Covid: HIV:     Baby Food                                GBS:   GC/CT:  Contraception  Pap: December 2021 negative  CBB     CS/VBAC    Support Person January 2022          Previous Version       Preterm labor symptoms and general obstetric precautions including but not limited to vaginal bleeding, contractions, leaking of fluid and fetal movement were reviewed in detail with the patient.   Return in about 4 weeks (around 09/14/2020) for rob.  09/16/2020, CNM 08/17/2020 10:30 AM

## 2020-08-21 LAB — RPR+RH+ABO+RUB AB+AB SCR+CB...
Antibody Screen: NEGATIVE
HIV Screen 4th Generation wRfx: NONREACTIVE
Hematocrit: 39.8 % (ref 34.0–46.6)
Hemoglobin: 13.5 g/dL (ref 11.1–15.9)
Hepatitis B Surface Ag: NEGATIVE
MCH: 30.8 pg (ref 26.6–33.0)
MCHC: 33.9 g/dL (ref 31.5–35.7)
MCV: 91 fL (ref 79–97)
Platelets: 189 10*3/uL (ref 150–450)
RBC: 4.39 x10E6/uL (ref 3.77–5.28)
RDW: 12.8 % (ref 11.7–15.4)
RPR Ser Ql: NONREACTIVE
Rh Factor: POSITIVE
Rubella Antibodies, IGG: 2.53 index (ref >0.99)
Varicella zoster IgG: 505 index (ref >165)
WBC: 8.4 10*3/uL (ref 3.4–10.8)

## 2020-08-21 LAB — COMPREHENSIVE METABOLIC PANEL
ALT: 15 IU/L (ref 0–32)
AST: 10 IU/L (ref 0–40)
Albumin/Globulin Ratio: 1.8 (ref 1.2–2.2)
Albumin: 4 g/dL (ref 3.9–5.0)
Alkaline Phosphatase: 59 IU/L (ref 44–121)
BUN/Creatinine Ratio: 10 (ref 9–23)
BUN: 5 mg/dL — ABNORMAL LOW (ref 6–20)
Bilirubin Total: 0.3 mg/dL (ref 0.0–1.2)
CO2: 21 mmol/L (ref 20–29)
Calcium: 8.7 mg/dL (ref 8.7–10.2)
Chloride: 103 mmol/L (ref 96–106)
Creatinine, Ser: 0.49 mg/dL — ABNORMAL LOW (ref 0.57–1.00)
Globulin, Total: 2.2 g/dL (ref 1.5–4.5)
Glucose: 72 mg/dL (ref 65–99)
Potassium: 3.9 mmol/L (ref 3.5–5.2)
Sodium: 139 mmol/L (ref 134–144)
Total Protein: 6.2 g/dL (ref 6.0–8.5)
eGFR: 133 mL/min/{1.73_m2} (ref >59)

## 2020-08-21 LAB — GLUCOSE, 1 HOUR GESTATIONAL: Gestational Diabetes Screen: 79 mg/dL (ref 65–139)

## 2020-08-21 LAB — HGB FRACTIONATION CASCADE
Hgb A2: 2.5 % (ref 1.8–3.2)
Hgb A: 97 % (ref 96.4–98.8)
Hgb F: 0.5 % (ref 0.0–2.0)
Hgb S: 0 %

## 2020-09-14 ENCOUNTER — Ambulatory Visit (INDEPENDENT_AMBULATORY_CARE_PROVIDER_SITE_OTHER): Payer: Medicaid Other | Admitting: Obstetrics and Gynecology

## 2020-09-14 ENCOUNTER — Other Ambulatory Visit: Payer: Self-pay

## 2020-09-14 ENCOUNTER — Encounter: Payer: Self-pay | Admitting: Obstetrics and Gynecology

## 2020-09-14 VITALS — BP 132/82 | Wt 213.0 lb

## 2020-09-14 DIAGNOSIS — Z3689 Encounter for other specified antenatal screening: Secondary | ICD-10-CM

## 2020-09-14 DIAGNOSIS — Z1379 Encounter for other screening for genetic and chromosomal anomalies: Secondary | ICD-10-CM

## 2020-09-14 DIAGNOSIS — O0991 Supervision of high risk pregnancy, unspecified, first trimester: Secondary | ICD-10-CM

## 2020-09-14 DIAGNOSIS — O161 Unspecified maternal hypertension, first trimester: Secondary | ICD-10-CM

## 2020-09-14 DIAGNOSIS — Z3A12 12 weeks gestation of pregnancy: Secondary | ICD-10-CM

## 2020-09-14 DIAGNOSIS — Z3149 Encounter for other procreative investigation and testing: Secondary | ICD-10-CM

## 2020-09-14 LAB — POCT URINALYSIS DIPSTICK OB
Glucose, UA: NEGATIVE
POC,PROTEIN,UA: NEGATIVE

## 2020-09-14 MED ORDER — NIFEDIPINE ER OSMOTIC RELEASE 30 MG PO TB24: 30.0000 mg | ORAL_TABLET | Freq: Every day | ORAL | 2 refills | Status: DC

## 2020-09-14 NOTE — Progress Notes (Signed)
Routine Prenatal Care Visit  Subjective  Emily Pearson is a 25 y.o. [phone] at 100w4d being seen today for ongoing prenatal care.  She is currently monitored for the following issues for this high-risk pregnancy and has Hx of preeclampsia, prior pregnancy, currently pregnant; Supervision of high risk pregnancy in first trimester; and Obesity affecting pregnancy in first trimester on their problem list.  ----------------------------------------------------------------------------------- Patient reports no complaints.   Contractions: Not present. Vag. Bleeding: None.  Movement: Absent. Denies leaking of fluid.  ----------------------------------------------------------------------------------- The following portions of the patient's history were reviewed and updated as appropriate: allergies, current medications, past family history, past medical history, past social history, past surgical history and problem list. Problem list updated.   Objective  Blood pressure 140/82 - 132/82, weight 213 lb (96.6 kg), last menstrual period 06/18/2020. Pregravid weight 205 lb (93 kg) Total Weight Gain 8 lb (3.629 kg) Urinalysis:      Fetal Status: Fetal Heart Rate (bpm): 155   Movement: Absent     General:  Alert, oriented and cooperative. Patient is in no acute distress.  Skin: Skin is warm and dry. No rash noted.   Cardiovascular: Normal heart rate noted  Respiratory: Normal respiratory effort, no problems with respiration noted  Abdomen: Soft, gravid, appropriate for gestational age. Pain/Pressure: Absent     Pelvic:  Cervical exam deferred        Extremities: Normal range of motion.     ental Status: Normal mood and affect. Normal behavior. Normal judgment and thought content.    Assessment   25 y.o. [id number] at [redacted]w[redacted]d by  03/25/2021, by Last Menstrual Period presenting for routine prenatal visit  Plan   [email or web address]  Problems (from 06/18/20 to present)    Problem Noted Resolved   Supervision  of high risk pregnancy in first trimester 08/11/2020 by Tresea Mall, CNM No   Overview Addendum 08/17/2020 10:29 AM by Tresea Mall, CNM    Nursing Staff Provider  Office Location  [facility] Dating   LMP = 8wk [place]  Language  English Anatomy US   ordered  Flu Vaccine   UTD Genetic Screen  NIPS: Collected  TDaP vaccine    Hgb A1C or  GTT Early : Third trimester :   Rhogam   n/a   LAB RESULTS   Feeding Plan  Breast Blood Type O/Positive/-- (03/24 1208)   Contraception   Antibody Negative (03/24 1208)  Circumcision  Rubella 2.53 (03/24 1208)  Pediatrician   RPR Non Reactive (03/24 1208)   Support Person  Earl Lites HBsAg Negative (03/24 1208)   Prenatal Classes  HIV Non Reactive (03/24 1208)    Varicella  immune  BTL Consent  GBS  (For PCN allergy, check sensitivities)        VBAC Consent  Pap  12/21 - negative    Hgb Electro   normal hgb   Covid  vax x2 CF      SMA              Previous Version      -Reviewed BP today with patient, discussed elevated BP at early gestational age - reviewed findings with Dr. Jerene Pitch via phone. Plan made to initiate Nifedipine 30 mg XL and f/u in 1 week for BP check -Discussed bASA for preE PP. Patient stated understanding - will start today -Orders placed for anatomy scan in 6 to 8 weeks  Obstetric precautions including but not limited to vaginal bleeding, contractions, leaking of fluid and fetal movement were  reviewed in detail with the patient.    Return in about 1 week (around 09/21/2020) for HROB with Dr Jerene Pitch for BP f/u - anatomy US placed for 6-8 weeks.  Zipporah Plants, CNM, MSN [facility], [facility] 09/14/2020, 11:50 AM

## 2020-09-18 ENCOUNTER — Other Ambulatory Visit: Payer: Self-pay | Admitting: Obstetrics and Gynecology

## 2020-09-18 DIAGNOSIS — Z3689 Encounter for other specified antenatal screening: Secondary | ICD-10-CM

## 2020-09-18 NOTE — Progress Notes (Signed)
.  mf

## 2020-09-20 LAB — MATERNIT 21 PLUS CORE, BLOOD
Fetal Fraction: 16
Result (T21): NEGATIVE
Trisomy 13 (Patau syndrome): NEGATIVE
Trisomy 18 (Edwards syndrome): NEGATIVE
Trisomy 21 (Down syndrome): NEGATIVE

## 2020-09-21 ENCOUNTER — Other Ambulatory Visit: Payer: Self-pay | Admitting: Obstetrics and Gynecology

## 2020-09-21 DIAGNOSIS — Z3689 Encounter for other specified antenatal screening: Secondary | ICD-10-CM

## 2020-09-22 ENCOUNTER — Encounter: Payer: Self-pay | Admitting: Obstetrics and Gynecology

## 2020-09-22 ENCOUNTER — Ambulatory Visit (INDEPENDENT_AMBULATORY_CARE_PROVIDER_SITE_OTHER): Payer: Medicaid Other | Admitting: Obstetrics and Gynecology

## 2020-09-22 ENCOUNTER — Other Ambulatory Visit: Payer: Self-pay

## 2020-09-22 VITALS — BP 128/70 | Wt 211.0 lb

## 2020-09-22 DIAGNOSIS — Z3A13 13 weeks gestation of pregnancy: Secondary | ICD-10-CM

## 2020-09-22 DIAGNOSIS — O0991 Supervision of high risk pregnancy, unspecified, first trimester: Secondary | ICD-10-CM

## 2020-09-22 NOTE — Progress Notes (Signed)
    Routine Prenatal Care Visit  Subjective  Emily Pearson is a 25 y.o. [phone] at [redacted]w[redacted]d being seen today for ongoing prenatal care.  She is currently monitored for the following issues for this high-risk pregnancy and has Hx of preeclampsia, prior pregnancy, currently pregnant; Supervision of high risk pregnancy in first trimester; and Obesity affecting pregnancy in first trimester on their problem list.  ----------------------------------------------------------------------------------- Patient reports no complaints.   Contractions: Not present. Vag. Bleeding: None.  Movement: Absent. Denies leaking of fluid.  ----------------------------------------------------------------------------------- The following portions of the patient's history were reviewed and updated as appropriate: allergies, current medications, past family history, past medical history, past social history, past surgical history and problem list. Problem list updated.   Objective  Blood pressure 128/70, weight 211 lb (95.7 kg), last menstrual period 06/18/2020. Pregravid weight 205 lb (93 kg) Total Weight Gain 6 lb (2.722 kg) Urinalysis:      Fetal Status: Fetal Heart Rate (bpm): 152   Movement: Absent     General:  Alert, oriented and cooperative. Patient is in no acute distress.  Skin: Skin is warm and dry. No rash noted.   Cardiovascular: Normal heart rate noted  Respiratory: Normal respiratory effort, no problems with respiration noted  Abdomen: Soft, gravid, appropriate for gestational age. Pain/Pressure: Absent     Pelvic:  Cervical exam deferred        Extremities: Normal range of motion.     Mental Status: Normal mood and affect. Normal behavior. Normal judgment and thought content.     Assessment   25 y.o. [id number] at [redacted]w[redacted]d by  03/25/2021, by Last Menstrual Period presenting for routine prenatal visit  Plan   pregnancy4  Problems (from 06/18/20 to present)    Problem Noted Resolved   Supervision of  high risk pregnancy in first trimester 08/11/2020 by Tresea Mall, CNM No   Overview Addendum 09/22/2020  5:04 PM by Natale Milch, MD     Nursing Staff Provider  Office Location  [facility] Dating   LMP = 8wk [place]  Language  English Anatomy US   ordered  Flu Vaccine   UTD Genetic Screen  NIPS: normal xx  TDaP vaccine    Hgb A1C or  GTT Early : 79 Third trimester :   Rhogam   n/a   LAB RESULTS   Feeding Plan  Breast Blood Type O/Positive/-- (03/24 1208)   Contraception   Antibody Negative (03/24 1208)  Circumcision  Rubella 2.53 (03/24 1208)  Pediatrician   RPR Non Reactive (03/24 1208)   Support Person  Earl Lites HBsAg Negative (03/24 1208)   Prenatal Classes  HIV Non Reactive (03/24 1208)    Varicella  immune  BTL Consent  GBS  (For PCN allergy, check sensitivities)        VBAC Consent  Pap  12/21 - negative    Hgb Electro   normal hgb   Covid  vax x2 CF      SMA         History of Preeclampsia CHTN- started procardia, taking ASA       Previous Version       Gestational age appropriate obstetric precautions including but not limited to vaginal bleeding, contractions, leaking of fluid and fetal movement were reviewed in detail with the patient.    Return in about 3 weeks (around 10/13/2020) for ROB in person.  Natale Milch MD [facility], [facility] 09/22/2020, 5:04 PM

## 2020-09-22 NOTE — Progress Notes (Signed)
ROB- no concerns 

## 2020-09-25 LAB — INHERITEST CORE(CF97,SMA,FRAX)

## 2020-09-30 ENCOUNTER — Other Ambulatory Visit: Payer: Self-pay | Admitting: Obstetrics and Gynecology

## 2020-09-30 DIAGNOSIS — O161 Unspecified maternal hypertension, first trimester: Secondary | ICD-10-CM

## 2020-10-30 ENCOUNTER — Other Ambulatory Visit: Payer: Self-pay | Admitting: *Deleted

## 2020-10-30 ENCOUNTER — Ambulatory Visit: Payer: Medicaid Other | Admitting: *Deleted

## 2020-10-30 ENCOUNTER — Other Ambulatory Visit: Payer: Self-pay

## 2020-10-30 ENCOUNTER — Encounter: Payer: Self-pay | Admitting: *Deleted

## 2020-10-30 ENCOUNTER — Ambulatory Visit: Payer: Medicaid Other | Attending: Obstetrics and Gynecology

## 2020-10-30 DIAGNOSIS — O10912 Unspecified pre-existing hypertension complicating pregnancy, second trimester: Secondary | ICD-10-CM

## 2020-10-30 DIAGNOSIS — Z3689 Encounter for other specified antenatal screening: Secondary | ICD-10-CM | POA: Insufficient documentation

## 2020-10-30 DIAGNOSIS — O0991 Supervision of high risk pregnancy, unspecified, first trimester: Secondary | ICD-10-CM | POA: Insufficient documentation

## 2020-11-06 ENCOUNTER — Other Ambulatory Visit: Payer: Medicaid Other

## 2020-11-28 ENCOUNTER — Ambulatory Visit: Payer: Medicaid Other

## 2020-11-28 ENCOUNTER — Ambulatory Visit: Payer: Medicaid Other | Attending: Obstetrics and Gynecology

## 2021-03-12 IMAGING — RF DG HYSTEROGRAM
9 series · 12 of 12 positions shown · non-contrast
Comparison: No prior.

CLINICAL DATA: Infertility.

EXAM:
HYSTEROSALPINGOGRAM
TECHNIQUE: Following hysterosalpingogram by gynecology images obtained.

[Series 1: fluoro_hsg_2fps_bw · 0.17mm/px · 1 of 1 slices shown (1 of 9)]
[im 1/1]
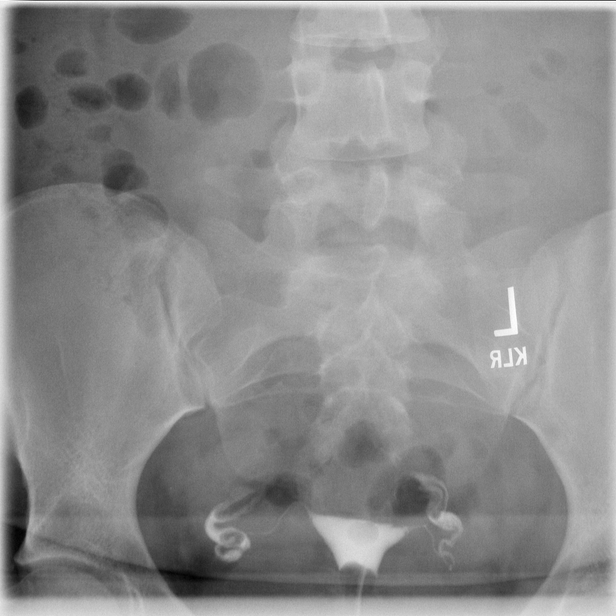

[Series 2: fluoro_hsg_2fps_bw · 0.17mm/px · 1 of 1 slices shown (2 of 9)]
[im 1/1]
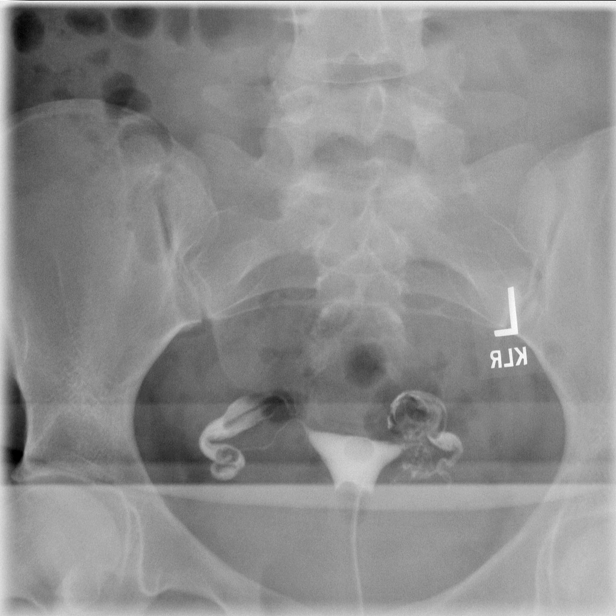

[Series 3: fluoro_hsg_2fps_bw · 0.17mm/px · 2 of 2 frames shown (3 of 9)]
[frame 1/2]
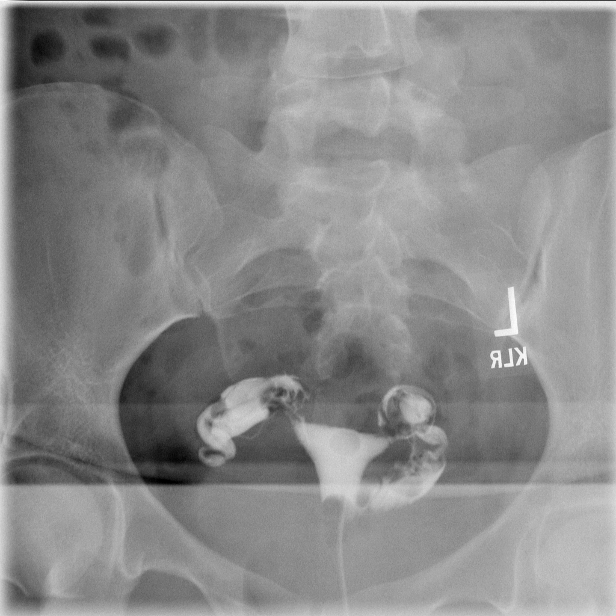
[frame 2/2]
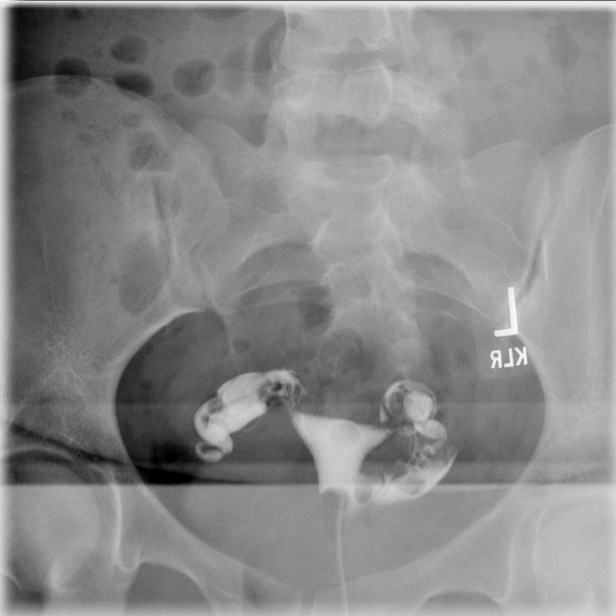

[Series 4: fluoro_hsg_2fps_bw · 0.17mm/px · 1 of 1 slices shown (4 of 9)]
[im 1/1]
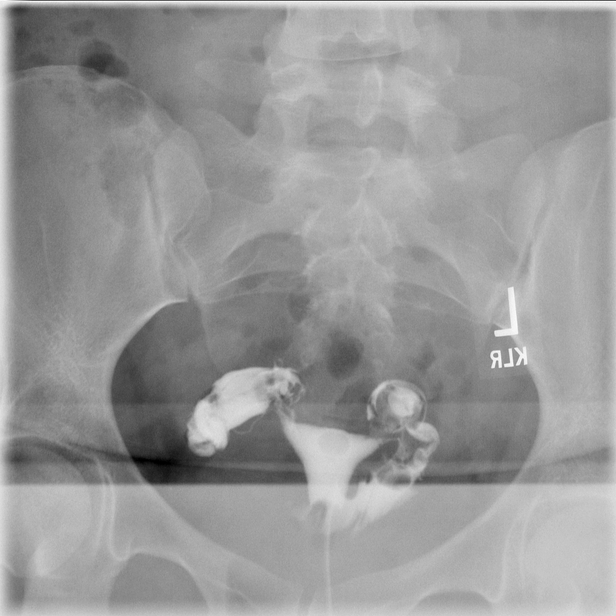

[Series 5: fluoro_hsg_2fps_bw · 0.17mm/px · 1 of 1 slices shown (5 of 9)]
[im 1/1]
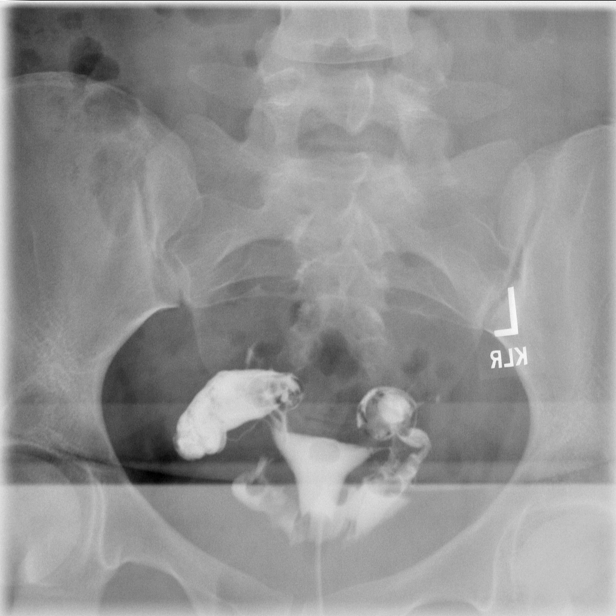

[Series 6: fluoro_hsg_2fps_bw · 0.17mm/px · 2 of 2 frames shown (6 of 9)]
[frame 1/2]
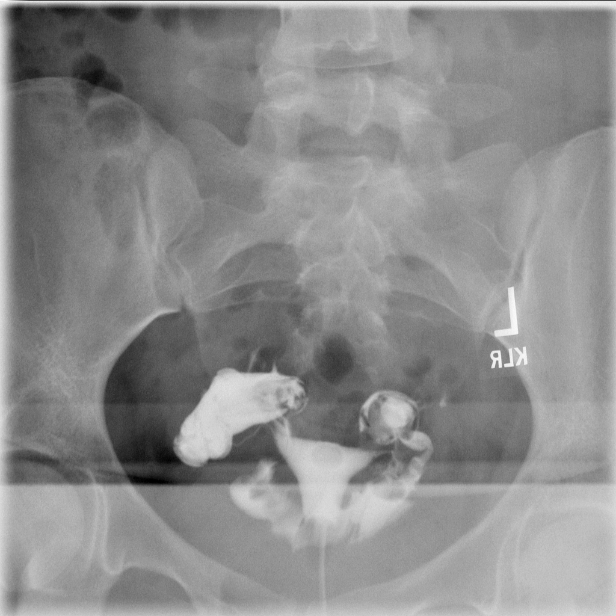
[frame 2/2]
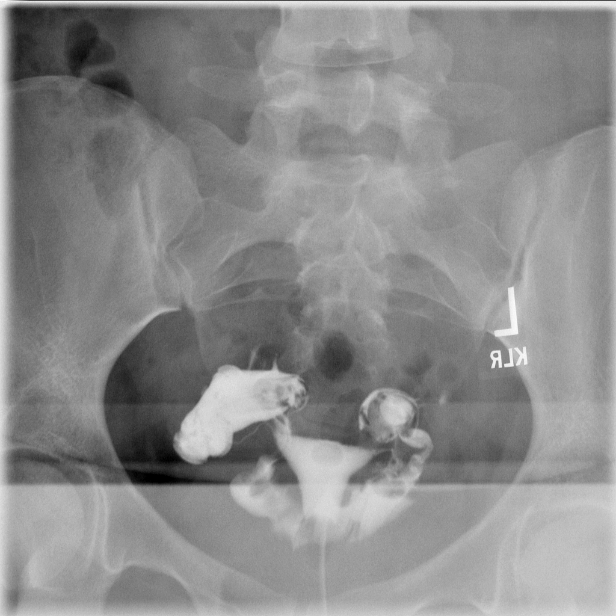

[Series 7: fluoro_hsg_2fps_bw · 0.17mm/px · 2 of 2 frames shown (7 of 9)]
[frame 1/2]
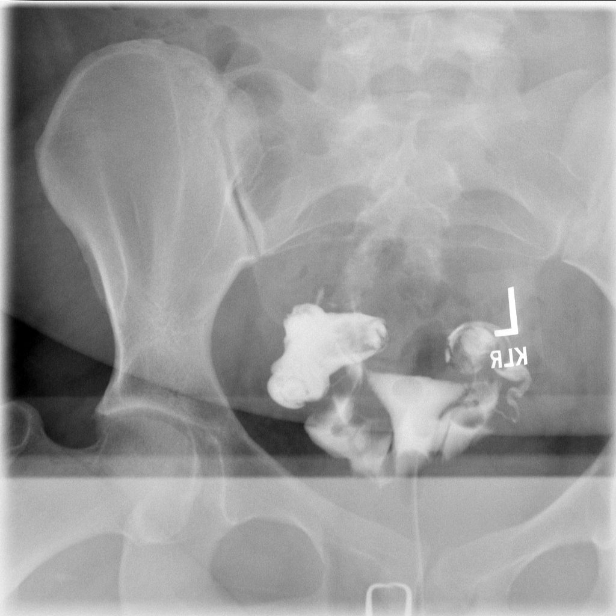
[frame 2/2]
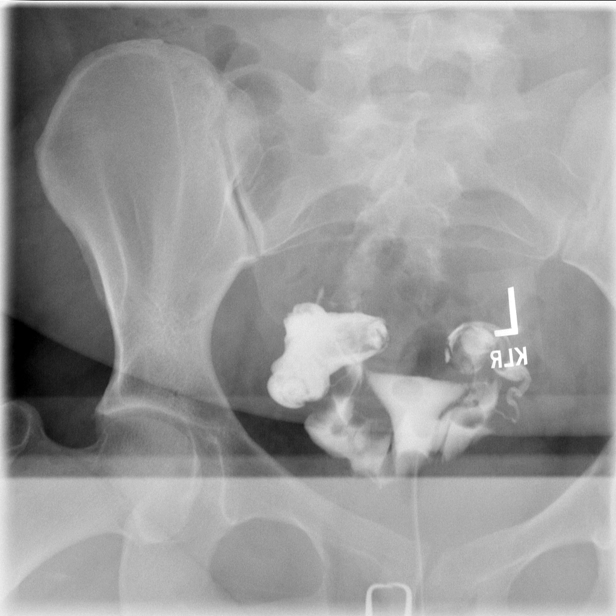

[Series 8: fluoro_hsg_2fps_bw · 0.17mm/px · 1 of 1 slices shown (8 of 9)]
[im 1/1]
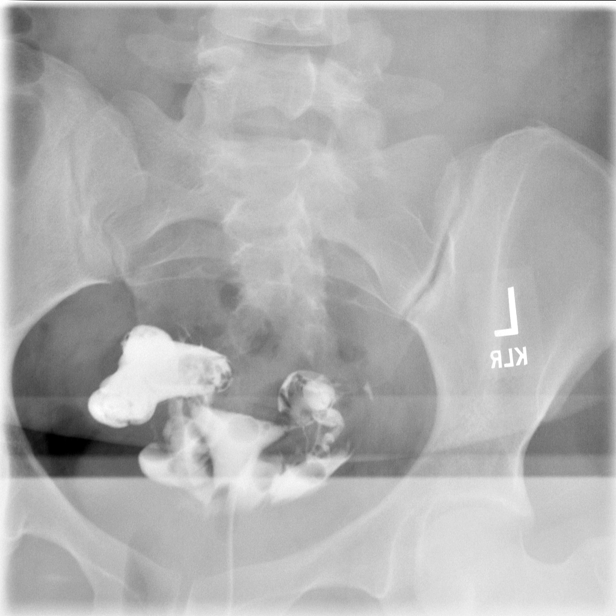

[Series 9: fluoro_hsg_2fps_bw · 0.17mm/px · 1 of 1 slices shown (9 of 9)]
[im 1/1]
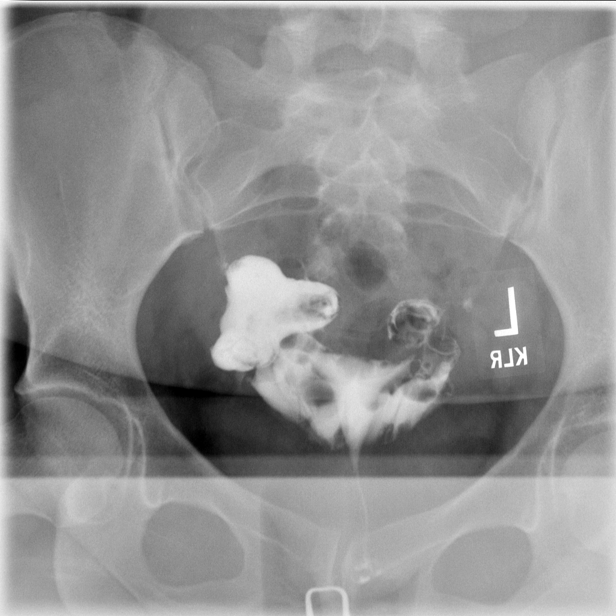

[12 of 12 positions shown; findings below may reference images not displayed]

FLUOROSCOPY TIME:  Radiation Exposure Index (as provided by the
fluoroscopic device): 90.6 mGy

If the device does not provide the exposure index:

Fluoroscopy Time:  1 minutes 54 seconds
FINDINGS: Bilateral tubal dilation, right side greater than left. Filling
defects which may represent debris may be within the fallopian
tubes. Rapid spill on the left noted. Very delayed mild spill noted
on the right noted. Uterus appears unremarkable.
IMPRESSION: Bilateral fallopian tube dilation, right side greater than left.
Filling defects which may represent debris may be within the
fallopian tubes. Rapid spill on the left noted. Very delayed mild
spill on the right noted.
